# Patient Record
Sex: Male | Born: 1995 | Race: Black or African American | Hispanic: No | Marital: Single
Health system: Southern US, Community
[De-identification: ages and names within clinical notes are randomized; demographics above are authoritative.]

---

## 2018-09-18 ENCOUNTER — Other Ambulatory Visit: Payer: Self-pay

## 2018-09-18 ENCOUNTER — Emergency Department (HOSPITAL_COMMUNITY)
Admission: EM | Admit: 2018-09-18 | Discharge: 2018-09-18 | Disposition: A | Discharge status: Home / Self Care | Payer: BLUE CROSS/BLUE SHIELD | Attending: Emergency Medicine | Admitting: Emergency Medicine

## 2018-09-18 ENCOUNTER — Encounter (HOSPITAL_COMMUNITY): Payer: Self-pay

## 2018-09-18 ENCOUNTER — Emergency Department (HOSPITAL_COMMUNITY): Payer: BLUE CROSS/BLUE SHIELD

## 2018-09-18 DIAGNOSIS — S43004A Unspecified dislocation of right shoulder joint, initial encounter: Secondary | ICD-10-CM | POA: Diagnosis not present

## 2018-09-18 DIAGNOSIS — Y92321 Football field as the place of occurrence of the external cause: Secondary | ICD-10-CM | POA: Diagnosis not present

## 2018-09-18 DIAGNOSIS — Y9361 Activity, american tackle football: Secondary | ICD-10-CM | POA: Diagnosis not present

## 2018-09-18 DIAGNOSIS — Y999 Unspecified external cause status: Secondary | ICD-10-CM | POA: Insufficient documentation

## 2018-09-18 DIAGNOSIS — W500XXA Accidental hit or strike by another person, initial encounter: Secondary | ICD-10-CM | POA: Diagnosis not present

## 2018-09-18 DIAGNOSIS — S4991XA Unspecified injury of right shoulder and upper arm, initial encounter: Secondary | ICD-10-CM | POA: Diagnosis present

## 2018-09-18 MED ORDER — FENTANYL CITRATE (PF) 100 MCG/2ML IJ SOLN
50.0000 ug | Freq: Once | INTRAMUSCULAR | Status: DC
  Filled 2018-09-18: qty 2

## 2018-09-18 MED ORDER — PROPOFOL 10 MG/ML IV BOLUS
1.0000 mg/kg | Freq: Once | INTRAVENOUS | Status: AC
  Administered 2018-09-18: 75 mg via INTRAVENOUS
  Filled 2018-09-18: qty 20

## 2018-09-18 MED ORDER — FENTANYL CITRATE (PF) 100 MCG/2ML IJ SOLN
50.0000 ug | Freq: Once | INTRAMUSCULAR | Status: AC
  Administered 2018-09-18: 50 ug via INTRAVENOUS
  Filled 2018-09-18: qty 2

## 2018-09-18 NOTE — ED Provider Notes (Signed)
.  Sedation Date/Time: 09/18/2018 11:51 PM Performed by: Sabas SousBero, Trinity Hyland M, MD Authorized by: Sabas SousBero, Dashun Borre M, MD   Consent:    Consent obtained:  Written and verbal   Consent given by:  Patient   Risks discussed:  Respiratory compromise necessitating ventilatory assistance and intubation Universal protocol:    Immediately prior to procedure a time out was called: yes   Indications:    Procedure performed:  Dislocation reduction   Procedure necessitating sedation performed by:  Physician performing sedation Pre-sedation assessment:    Time since last food or drink:  8am   ASA classification: class 1 - normal, healthy patient     Mallampati score:  I - soft palate, uvula, fauces, pillars visible   Pre-sedation assessments completed and reviewed: airway patency and cardiovascular function   Immediate pre-procedure details:    Reassessment: Patient reassessed immediately prior to procedure     Reviewed: vital signs     Verified: bag valve mask available, emergency equipment available, intubation equipment available, IV patency confirmed, oxygen available and suction available   Procedure details (see MAR for exact dosages):    Preoxygenation:  Nasal cannula   Sedation:  Propofol   Analgesia:  Fentanyl   Intra-procedure monitoring:  Blood pressure monitoring, continuous capnometry, continuous pulse oximetry, cardiac monitor and frequent vital sign checks   Intra-procedure events: none     Total Provider sedation time (minutes):  16 Post-procedure details:    Patient is stable for discharge or admission: yes     Patient tolerance:  Tolerated well, no immediate complications Reduction of right shoulder dislocation Date/Time: 09/18/2018 11:54 PM Performed by: Sabas SousBero, Lamiah Marmol M, MD Authorized by: Sabas SousBero, Donaciano Range M, MD  Consent: Verbal consent obtained. Written consent obtained. Risks and benefits: risks, benefits and alternatives were discussed Consent given by: patient Patient understanding:  patient states understanding of the procedure being performed Patient identity confirmed: verbally with patient and arm band Time out: Immediately prior to procedure a "time out" was called to verify the correct patient, procedure, equipment, support staff and site/side marked as required.  Sedation: Patient sedated: yes Sedation type: moderate (conscious) sedation Sedatives: propofol Analgesia: fentanyl Vitals: Vital signs were monitored during sedation.  Patient tolerance: Patient tolerated the procedure well with no immediate complications Comments: Traction countertraction, easy reduction with no comp occasions.   Elmer SowMichael M. Pilar PlateBero, MD [facility] [email or web address]     Sabas SousBero, Jamesetta Greenhalgh M, MD 09/18/18 [phone]

## 2018-09-18 NOTE — Discharge Instructions (Signed)
You can take Tylenol or Ibuprofen as directed for pain. You can alternate Tylenol and Ibuprofen every 4 hours. If you take Tylenol at 1pm, then you can take Ibuprofen at 5pm. Then you can take Tylenol again at 9pm.   Follow the RICE (Rest, Ice, Compression, Elevation) protocol as directed.   Wear the shoulder sling for support and stabilization.  Follow-up with referred orthopedic doctor for further evaluation.  Return to emergency department for any worsening pain in the shoulder, redness or swelling of the shoulder, numbness/weakness of your arm or any other worsening or concerning symptoms

## 2018-09-18 NOTE — ED Notes (Signed)
Pt woke easily. Sling applied by ortho tech.

## 2018-09-18 NOTE — ED Notes (Signed)
Pt alert, oriented.   Pt states improvement in R shoulder pain, decrease ROM.  +2 bilateral radial pulses, sensation and motor in tact.

## 2018-09-18 NOTE — ED Triage Notes (Signed)
Per EMS- [facility] [employer] [employer] [facility] During the game, "active throw" dislocated his right shoulder. Pt reports good sensation and pulses present. Position of comfort pt reports no pain. No other complaints upon arrival.

## 2018-09-18 NOTE — ED Provider Notes (Signed)
[facility]-EMERGENCY DEPT Provider Note   CSN: [id number] Arrival date & time: 09/18/18  1647     History   Chief Complaint Chief Complaint  Patient presents with  . Fall  . Shoulder Injury    Right side    HPI Cameron Graham is a 22 y.o. male who presents for evaluation of right shoulder pain that began this afternoon.  Patient reports that he was playing a football game and states that he went to catch a ball and collided with another player.  He is unsure of how he got hit in the shoulder but he does state that he fell down.  He reports that afterwards, he was unable to move the shoulder and reports pain to the top and anterior aspect of the right shoulder.  He states he did not hit his head or lose any consciousness.  Patient states that he does not have any pain to the elbow, wrist.  Patient denies any numbness/weakness.  The history is provided by the patient.    History reviewed. No pertinent past medical history.  There are no active problems to display for this patient.   History reviewed. No pertinent surgical history.      Home Medications    Prior to Admission medications   Medication Sig Start Date End Date Taking? Authorizing Provider  ibuprofen (ADVIL,MOTRIN) 200 MG tablet Take 400 mg by mouth every 6 (six) hours as needed for moderate pain.   Yes [provider]    Family History No family history on file.  Social History Social History   Tobacco Use  . Smoking status: Not on file  Substance Use Topics  . Alcohol use: Not on file  . Drug use: Not on file     Allergies   Patient has no known allergies.   Review of Systems Review of Systems  Musculoskeletal:       Right shoulder pain  Neurological: Negative for weakness and numbness.  All other systems reviewed and are negative.    Physical Exam Updated Vital Signs BP 130/75   Pulse 76   Temp 98.3 F (36.8 C) (Oral)   Resp 20   Ht 5\' 11"  (1.803 m)    Wt 108.9 kg   SpO2 99%   BMI 33.47 kg/m   Physical Exam  Constitutional: He appears well-developed and well-nourished.  HENT:  Head: Normocephalic and atraumatic.  Eyes: Conjunctivae and EOM are normal. Right eye exhibits no discharge. Left eye exhibits no discharge. No scleral icterus.  Cardiovascular:  Pulses:      Radial pulses are 2+ on the right side, and 2+ on the left side.  Pulmonary/Chest: Effort normal.  Musculoskeletal:  Tenderness palpation noted right shoulder with obvious deformity.  Limited range of motion secondary to pain.  No tenderness to palpation to right elbow, right wrist.  Full range of motion of left upper extremity without any difficulty.  Neurological: He is alert.  Sensation intact along major nerve distributions of BUE  Skin: Skin is warm and dry. Capillary refill takes less than 2 seconds.  Good distal cap refill.  RUE is not dusky in appearance or cool to touch.  Psychiatric: He has a normal mood and affect. His speech is normal and behavior is normal.  Nursing note and vitals reviewed.    ED Treatments / Results  Labs (all labs ordered are listed, but only abnormal results are displayed) Labs Reviewed - No data to display  EKG None  Radiology Dg Shoulder Right  Result Date: 09/18/2018 CLINICAL DATA:  Post reduction EXAM: RIGHT SHOULDER - 2+ VIEW COMPARISON:  09/18/2018 FINDINGS: Interval reduction of right humeral head dislocation, now with normal alignment. No definitive displaced fracture is seen. 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 IMPRESSION: Interval reduction of right humeral head dislocation. Electronically Signed   By: Jasmine Pang M.D.   On: 09/18/2018 19:00   Dg Shoulder Right  Result Date: 09/18/2018 CLINICAL DATA:  Shoulder pain EXAM: RIGHT SHOULDER - 2+ VIEW COMPARISON:  None. FINDINGS: Anterior, inferior dislocation of the right humeral head with respect to the glenoid fossa. No displaced  fracture is seen on the views submitted. IMPRESSION: Anterior, inferior dislocation of the right humeral head. Electronically Signed   By: Jasmine Pang M.D.   On: 09/18/2018 17:50    Procedures Reduction of dislocation Date/Time: 09/18/2018 6:48 PM Performed by: Maxwell Caul, PA-C Authorized by: Maxwell Caul, PA-C  Consent: Verbal consent obtained. Written consent obtained. Risks and benefits: risks, benefits and alternatives were discussed Consent given by: patient Patient understanding: patient states understanding of the procedure being performed Patient consent: the patient's understanding of the procedure matches consent given Procedure consent: procedure consent matches procedure scheduled Relevant documents: relevant documents present and verified Test results: test results available and properly labeled Site marked: the operative site was marked Imaging studies: imaging studies available Required items: required blood products, implants, devices, and special equipment available Patient identity confirmed: verbally with patient and arm band Time out: Immediately prior to procedure a "time out" was called to verify the correct patient, procedure, equipment, support staff and site/side marked as required.  Sedation: Patient sedated: yes Sedation type: moderate (conscious) sedation Sedatives: propofol Analgesia: see MAR for details Vitals: Vital signs were monitored during sedation.  Patient tolerance: Patient tolerated the procedure well with no immediate complications Comments: Patient was sedated with propofol.  Please see note from attending provider regarding sedation procedure.  Using the traction-countertraction technique, the right shoulder was successfully reduced.  After reduction, he was immediately placed into a sling immobilizer.    (including critical care time)  Medications Ordered in ED Medications  fentaNYL (SUBLIMAZE) injection 50 mcg (50 mcg  Intravenous Not Given 09/18/18 1853)  fentaNYL (SUBLIMAZE) injection 50 mcg (50 mcg Intravenous Given 09/18/18 1728)  propofol (DIPRIVAN) 10 mg/mL bolus/IV push 108.9 mg (75 mg Intravenous Given 09/18/18 1832)     Initial Impression / Assessment and Plan / ED Course  I have reviewed the triage vital signs and the nursing notes.  Pertinent labs & imaging results that were available during my care of the patient were reviewed by me and considered in my medical decision making (see chart for details).     22 year old male who presents for evaluation of right shoulder pain.  Patient reports he was playing a ball game when he went out for past and collided with another player.  Since then has had pain to the right shoulder and difficulty moving the right shoulder. Patient is afebrile, non-toxic appearing, sitting comfortably on examination table. Vital signs reviewed and stable. Patient is neurovascularly intact.  On exam, right shoulder with obvious deformity and tenderness palpation.  No tenderness noted to elbow, wrist.  Concern for dislocation versus fracture.  Analgesics ordered.  Plan for x-ray for evaluation.  X-ray shows an anterior shoulder dislocation.  No evidence of fracture.  Discussed results with patient.  I discussed treatment options with patient.  Patient is agreeable to  reduction with sedation here in the ED.  Reduction performed as documented above.  Attending provider performed sedation.  Patient was successful reduction.  He was immediately placed in a sling immobilizer.  Postreduction films ordered.  X-ray shows successful reduction.  Discussed results with patient.  He reports better after reduction.  Vital signs are stable.  He is alert and oriented and breathing without any difficulty.  Patient stable for discharge at this time. Encouraged at home supportive care measures. Plan to provide outpatient orthopedic referral for further follow-up and evaluation Patient had ample  opportunity for questions and discussion. All patient's questions were answered with full understanding. Strict return precautions discussed. Patient expresses understanding and agreement to plan.   Final Clinical Impressions(s) / ED Diagnoses   Final diagnoses:  Dislocation of right shoulder joint, initial encounter    ED Discharge Orders    None       Rosana HoesLayden, Lilja Soland A, PA-C 09/18/18 2028    Sabas SousBero, Michael M, MD 09/18/18 2351

## 2018-09-18 NOTE — ED Notes (Signed)
Bed: WA04 Expected date:  Expected time:  Means of arrival:  Comments: Shoulder dislocation?

## 2018-09-18 NOTE — ED Notes (Signed)
Sedation started. MD, PA, respiratory therapist, RN and ortho tech present.

## 2018-09-18 NOTE — ED Notes (Signed)
Pt sedated with 75 mg Propofol.

## 2018-09-18 NOTE — ED Notes (Signed)
KIM RN WILL BE ASSISTING EDP WITH PROCEDURE

## 2018-09-18 NOTE — ED Notes (Signed)
EDPA Provider at bedside. 

## 2018-09-18 NOTE — ED Notes (Signed)
Pt ambulating well at DC to go home with friends who will drive pt.  Pt states improvement in symptoms.  DC instructions provided and pt states understanding of instructions.

## 2018-09-18 NOTE — ED Notes (Signed)
Pt alert and oriented x4.Pt c/o mild soreness to R shoulder Pt transported to XR.

## 2020-02-12 IMAGING — CR DG SHOULDER 2+V*R*
3 series · 3 of 3 positions shown · non-contrast
Comparison: 09/18/2018

CLINICAL DATA: Post reduction

EXAM:
RIGHT SHOULDER - 2+ VIEW

[x shoulder ap right (1 of 3)]
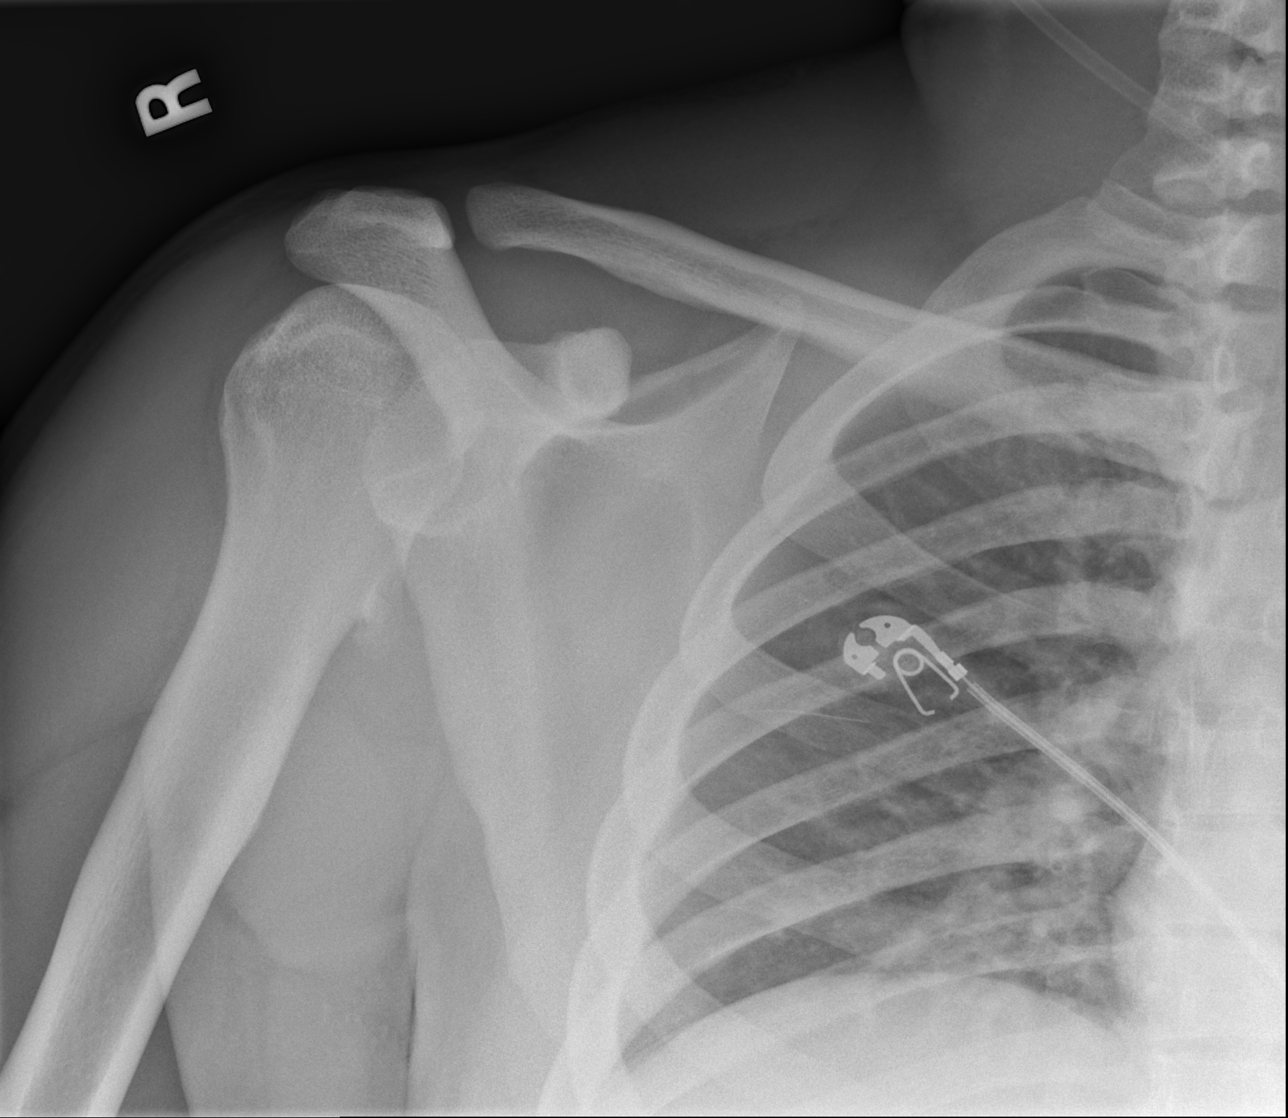

[x shoulder ap right (2 of 3)]
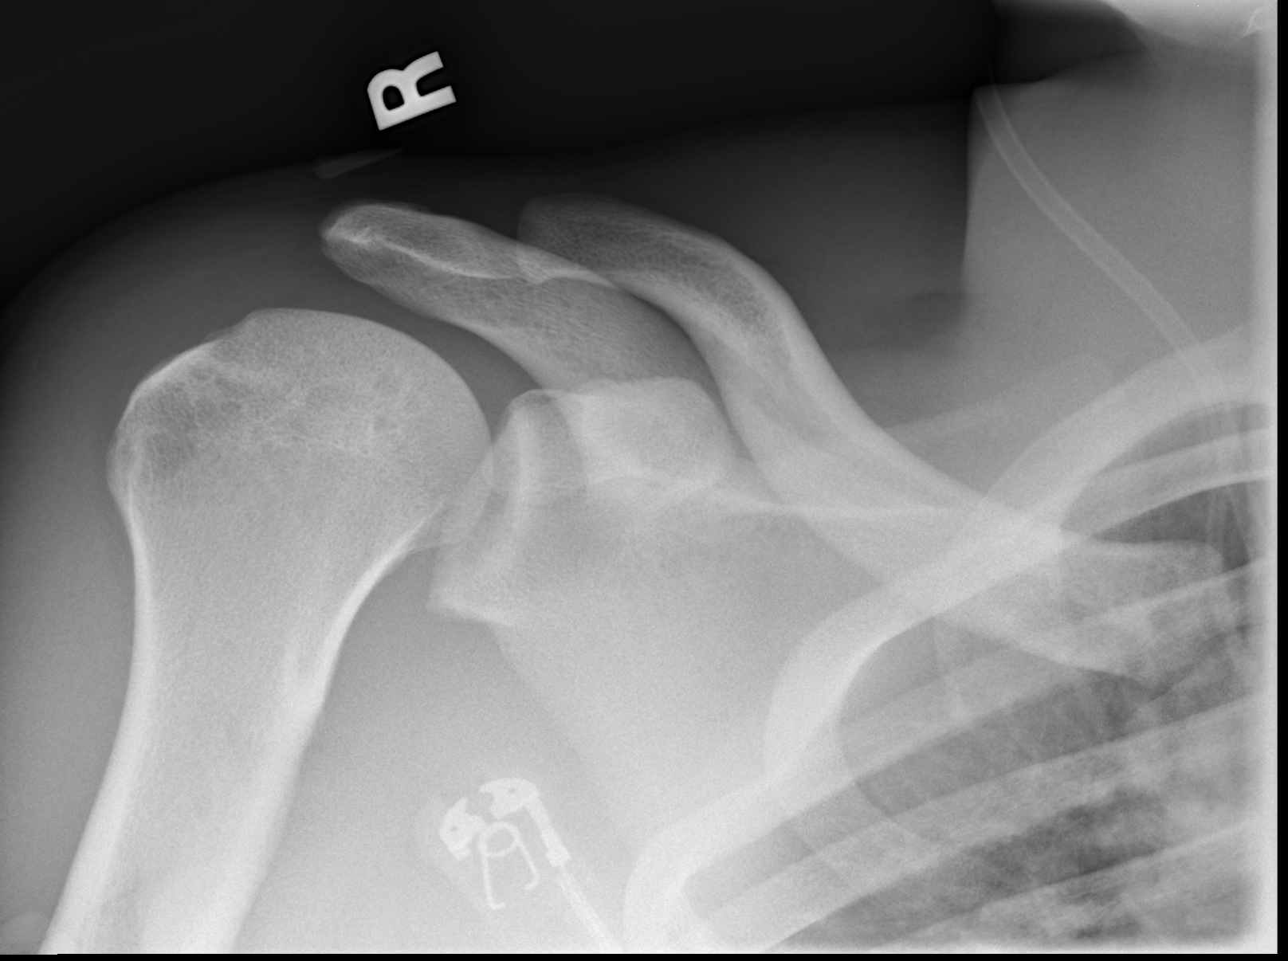

[x shoulder ap right (3 of 3)]
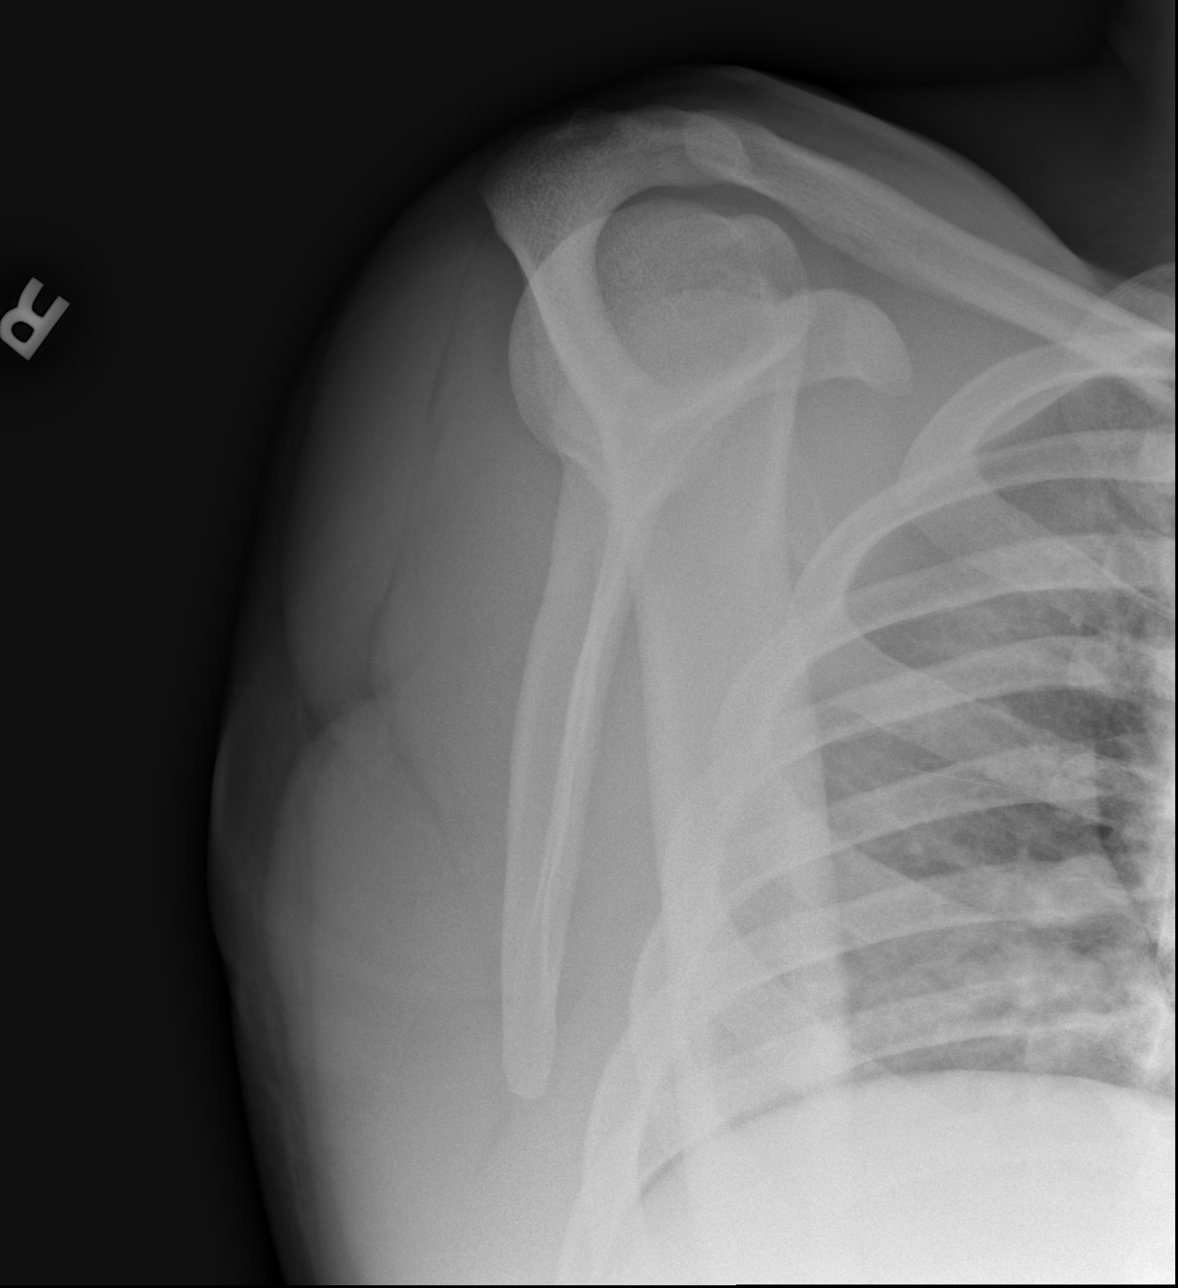

[3 of 3 positions shown; findings below may reference images not displayed]

FINDINGS: Interval reduction of right humeral head dislocation, now with
normal alignment. No definitive displaced fracture is seen. Possible
osteochondroma off the proximal metaphysis of the humerus.
IMPRESSION: Interval reduction of right humeral head dislocation.

## 2020-02-12 IMAGING — DX DG SHOULDER 2+V*R*
3 series · 3 of 3 positions shown · non-contrast
Comparison: None.

CLINICAL DATA: Shoulder pain

EXAM:
RIGHT SHOULDER - 2+ VIEW

[shoulder axial]
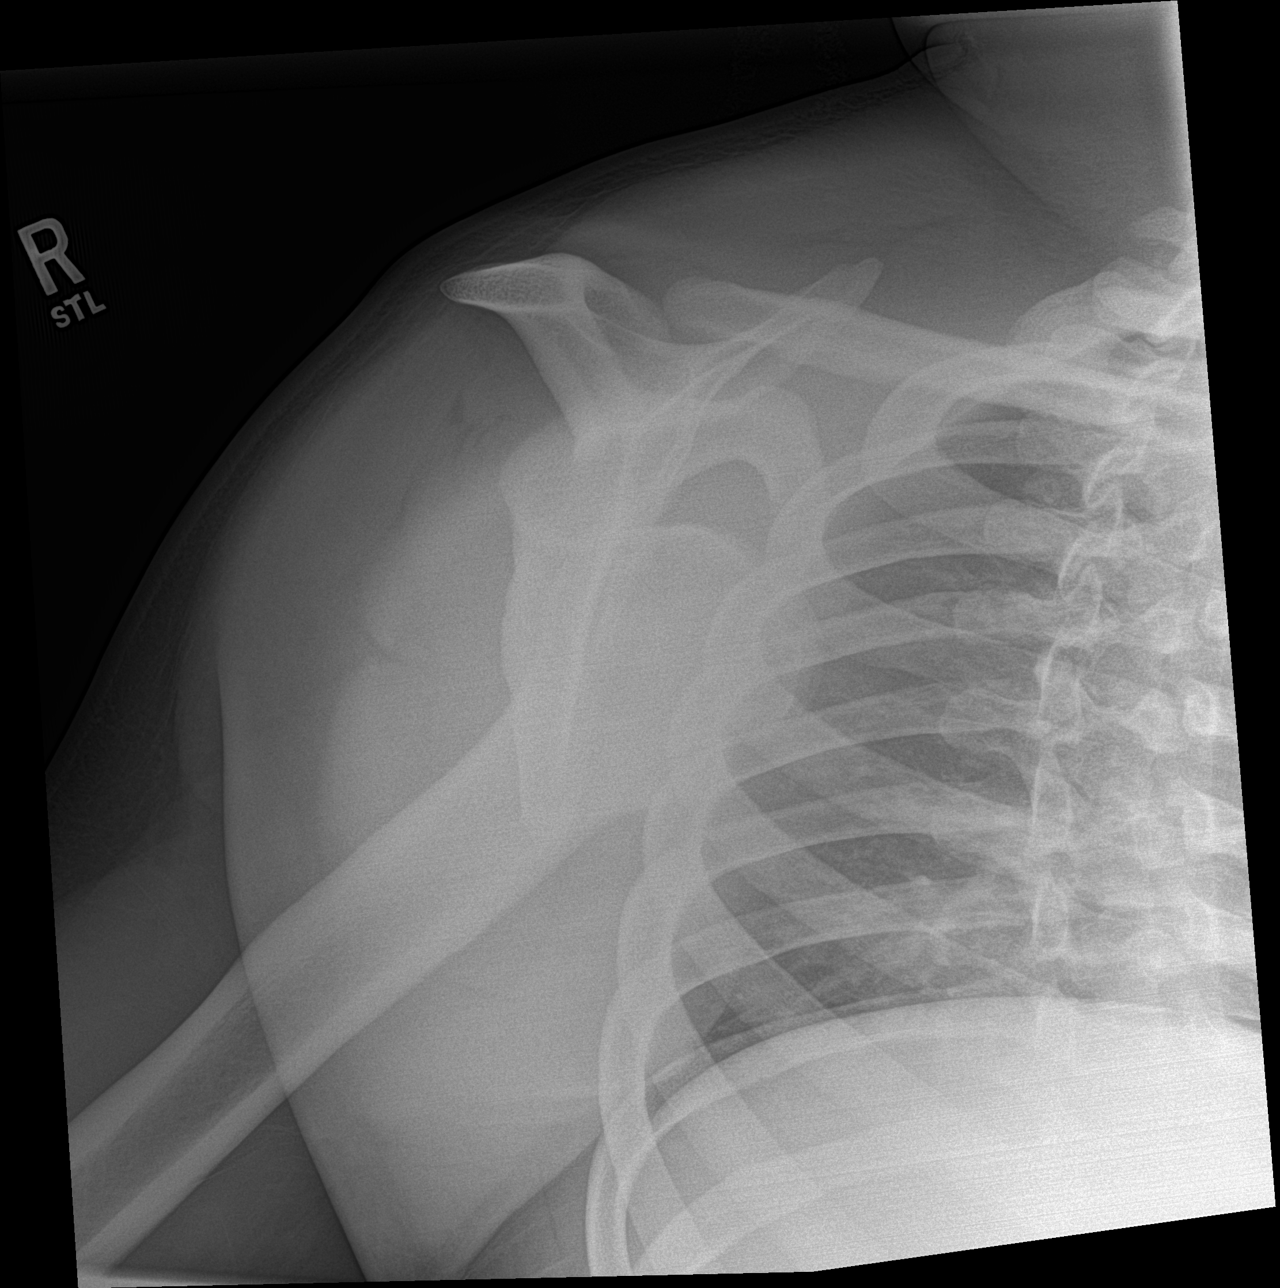

[shoulder ap]
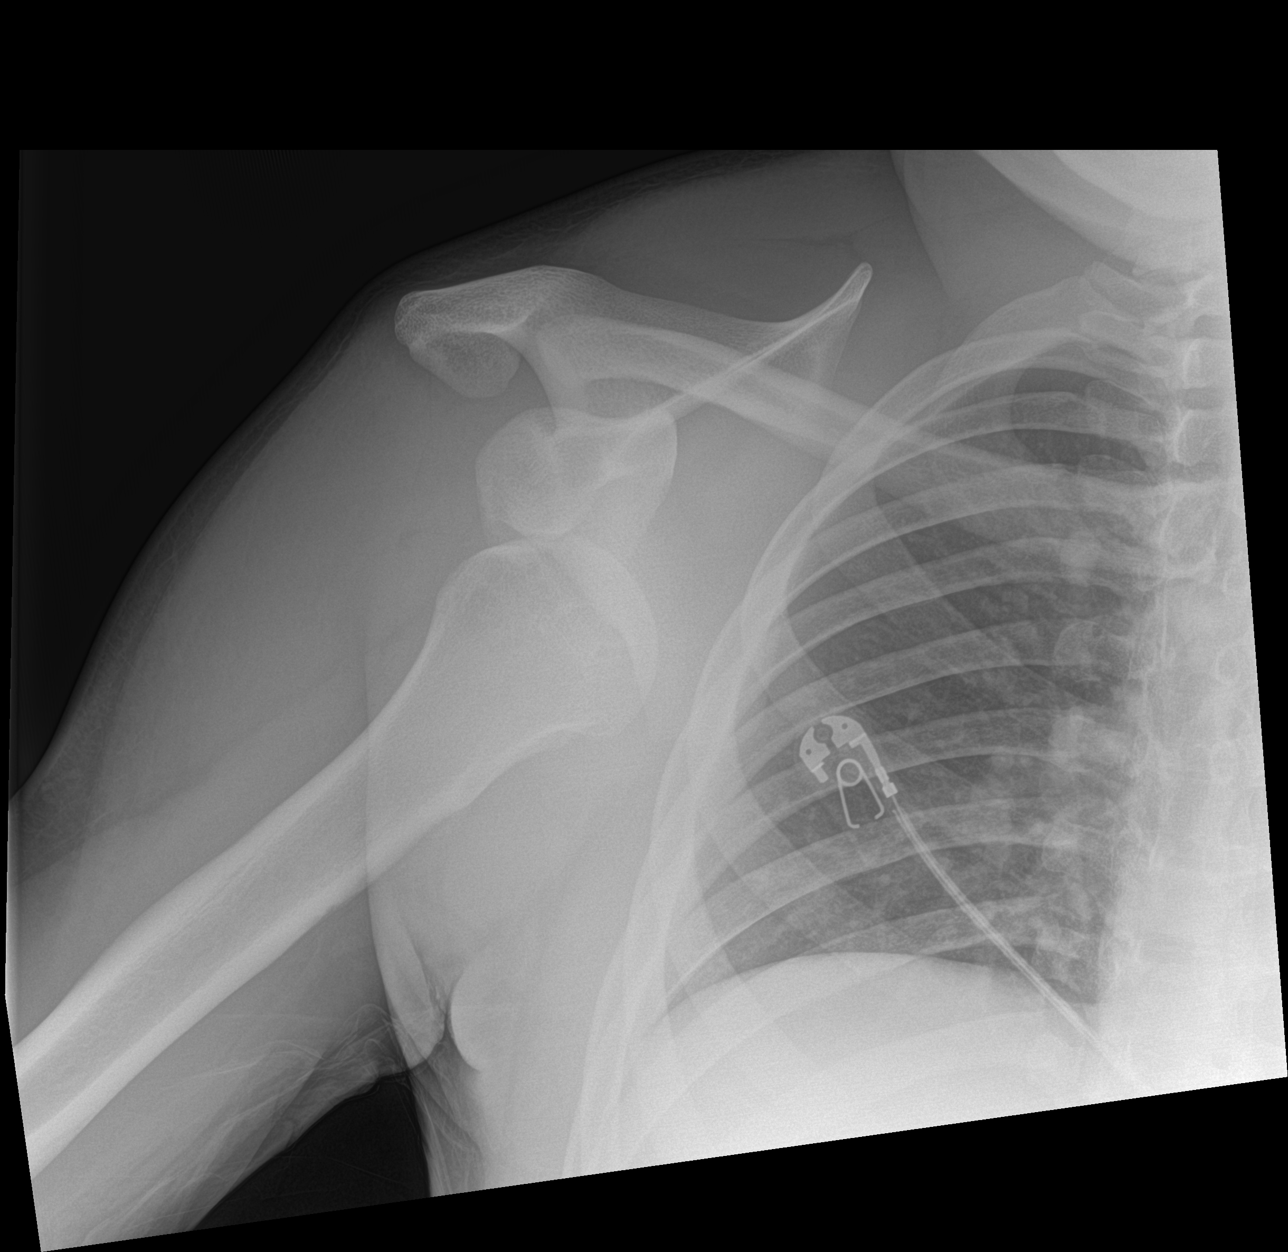

[shoulder obl]
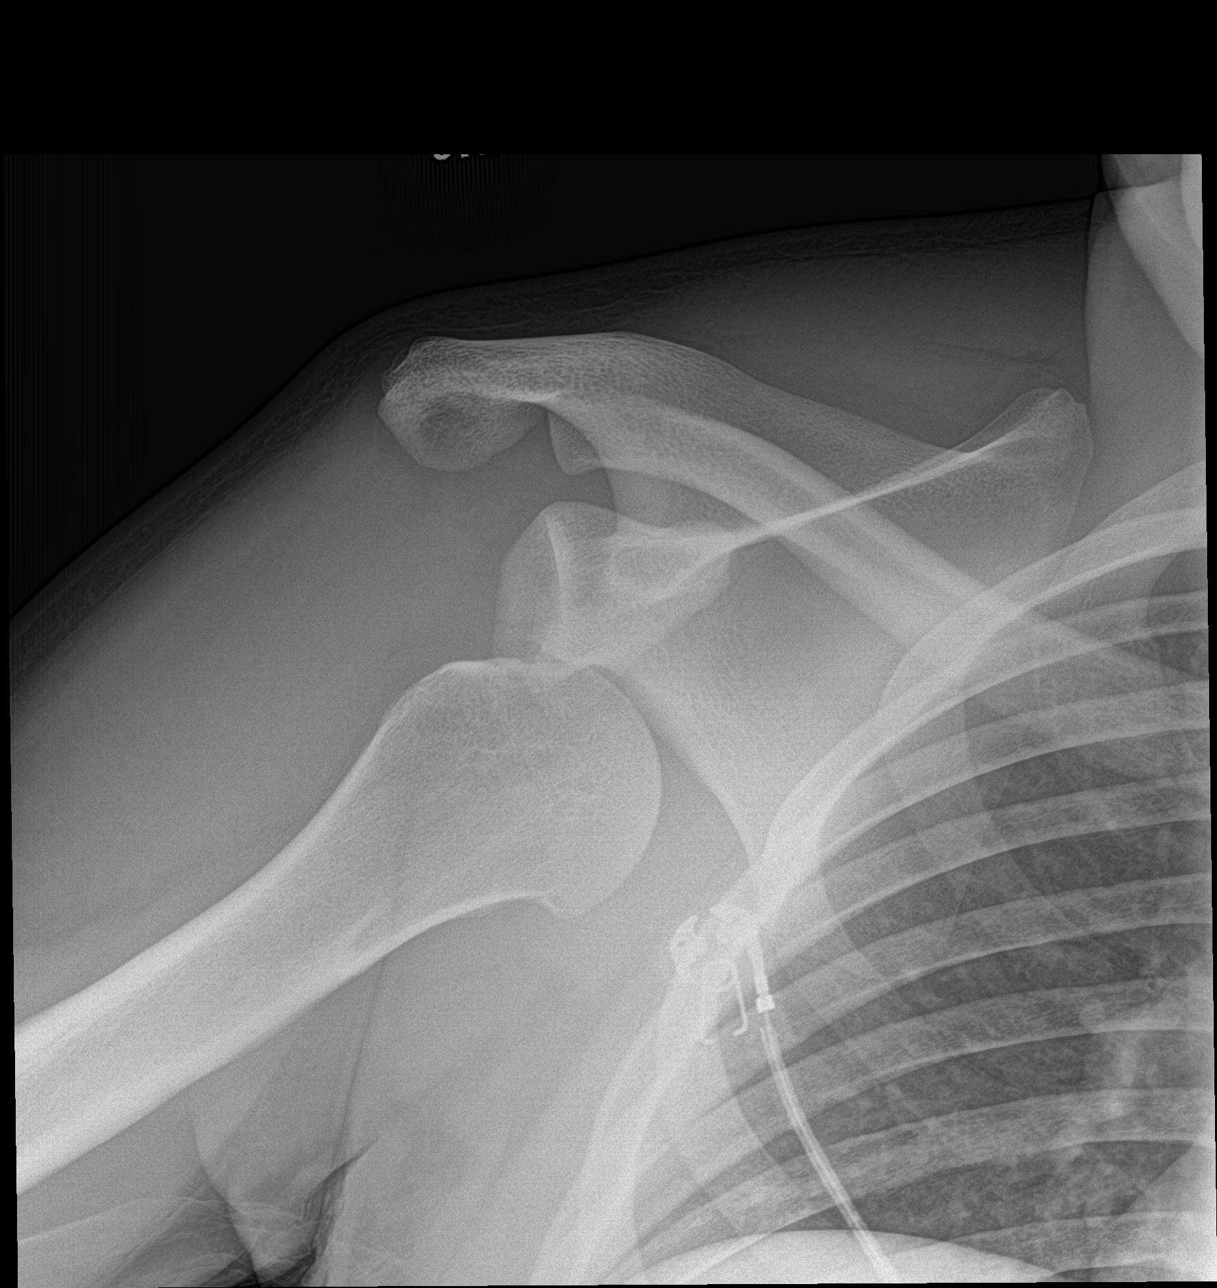

[3 of 3 positions shown; findings below may reference images not displayed]

FINDINGS: Anterior, inferior dislocation of the right humeral head with
respect to the glenoid fossa. No displaced fracture is seen on the
views submitted.
IMPRESSION: Anterior, inferior dislocation of the right humeral head.
